# Patient Record
Sex: Female | Born: 1970 | Hispanic: Yes | Marital: Married | Smoking: Never smoker
Health system: Southern US, Community
[De-identification: ages and names within clinical notes are randomized; demographics above are authoritative.]

---

## 2015-12-19 ENCOUNTER — Emergency Department: Payer: Commercial Managed Care - PPO

## 2015-12-19 ENCOUNTER — Emergency Department
Admission: EM | Admit: 2015-12-19 | Discharge: 2015-12-19 | Disposition: A | Discharge status: Home / Self Care | Payer: Commercial Managed Care - PPO | Attending: Emergency Medicine | Admitting: Emergency Medicine

## 2015-12-19 DIAGNOSIS — Z79899 Other long term (current) drug therapy: Secondary | ICD-10-CM | POA: Insufficient documentation

## 2015-12-19 DIAGNOSIS — Z792 Long term (current) use of antibiotics: Secondary | ICD-10-CM | POA: Insufficient documentation

## 2015-12-19 DIAGNOSIS — R079 Chest pain, unspecified: Secondary | ICD-10-CM | POA: Diagnosis not present

## 2015-12-19 DIAGNOSIS — Z3202 Encounter for pregnancy test, result negative: Secondary | ICD-10-CM | POA: Diagnosis not present

## 2015-12-19 LAB — URINALYSIS COMPLETE WITH MICROSCOPIC (ARMC ONLY)
BILIRUBIN URINE: NEGATIVE
GLUCOSE, UA: NEGATIVE mg/dL
Ketones, ur: NEGATIVE mg/dL
Leukocytes, UA: NEGATIVE
NITRITE: NEGATIVE
Protein, ur: 30 mg/dL — AB
Specific Gravity, Urine: 1.008 (ref 1.005–1.030)
pH: 6 (ref 5.0–8.0)

## 2015-12-19 LAB — TROPONIN I
TROPONIN I: 0.03 ng/mL (ref <0.031)
Troponin I: <0.03 ng/mL (ref <0.031)

## 2015-12-19 LAB — CBC
HEMATOCRIT: 38.9 % (ref 35.0–47.0)
Hemoglobin: 13.1 g/dL (ref 12.0–16.0)
MCH: 29.6 pg (ref 26.0–34.0)
MCHC: 33.6 g/dL (ref 32.0–36.0)
MCV: 88.2 fL (ref 80.0–100.0)
PLATELETS: 233 10*3/uL (ref 150–440)
RBC: 4.41 MIL/uL (ref 3.80–5.20)
RDW: 13.1 % (ref 11.5–14.5)
WBC: 9.1 10*3/uL (ref 3.6–11.0)

## 2015-12-19 LAB — BASIC METABOLIC PANEL
Anion gap: 9 (ref 5–15)
BUN: 11 mg/dL (ref 6–20)
CHLORIDE: 107 mmol/L (ref 101–111)
CO2: 24 mmol/L (ref 22–32)
CREATININE: 0.5 mg/dL (ref 0.44–1.00)
Calcium: 9.1 mg/dL (ref 8.9–10.3)
GFR calc Af Amer: >60 mL/min (ref >60)
GFR calc non Af Amer: >60 mL/min (ref >60)
GLUCOSE: 105 mg/dL — AB (ref 65–99)
POTASSIUM: 3.4 mmol/L — AB (ref 3.5–5.1)
Sodium: 140 mmol/L (ref 135–145)

## 2015-12-19 LAB — PREGNANCY, URINE: PREG TEST UR: NEGATIVE

## 2015-12-19 MED ORDER — SODIUM CHLORIDE 0.9 % IV BOLUS (SEPSIS)
1000.0000 mL | Freq: Once | INTRAVENOUS | Status: AC
Start: 2015-12-19 — End: 2015-12-19
  Administered 2015-12-19: 1000 mL via INTRAVENOUS

## 2015-12-19 NOTE — ED Notes (Signed)
Pt from home via EMS, reports she was driving when she began to have chest pain that radiated into her jaw. Pain now subsided into a heaviness

## 2015-12-19 NOTE — ED Notes (Signed)
MD at bedside. 

## 2015-12-19 NOTE — Discharge Instructions (Signed)
You have a follow-up appointment tomorrow at 2 PM with cardiology, Dr. Park Breed. Please make plans to attend this appointment, it is extremely important to have close follow-up.   You have been seen in the Emergency Department (ED) today for chest pain.  As we have discussed todays test results are normal, but you may require further testing.  Please follow up with the recommended doctor as instructed above in these documents regarding todays emergent visit and your recent symptoms to discuss further management.  Continue to take your regular medications. If you are not doing so already, please also take a daily baby aspirin (81 mg), at least until you follow up with your doctor.  Return to the Emergency Department (ED) if you experience any further chest pain/pressure/tightness, difficulty breathing, or sudden sweating, or other symptoms that concern you.   Chest Pain Observation It is often hard to give a specific diagnosis for the cause of chest pain. Among other possibilities your symptoms might be caused by inadequate oxygen delivery to your heart (angina). Angina that is not treated or evaluated can lead to a heart attack (myocardial infarction) or death. Blood tests, electrocardiograms, and X-rays may have been done to help determine a possible cause of your chest pain. After evaluation and observation, your health care provider has determined that it is unlikely your pain was caused by an unstable condition that requires hospitalization. However, a full evaluation of your pain may need to be completed, with additional diagnostic testing as directed. It is very important to keep your follow-up appointments. Not keeping your follow-up appointments could result in permanent heart damage, disability, or death. If there is any problem keeping your follow-up appointments, you must call your health care provider. HOME CARE INSTRUCTIONS  Due to the slight chance that your pain could be angina, it is  important to follow your health care provider's treatment plan and also maintain a healthy lifestyle:  Maintain or work toward achieving a healthy weight.  Stay physically active and exercise regularly.  Decrease your salt intake.  Eat a balanced, healthy diet. Talk to a dietitian to learn about heart-healthy foods.  Increase your fiber intake by including whole grains, vegetables, fruits, and nuts in your diet.  Avoid situations that cause stress, anger, or depression.  Take medicines as advised by your health care provider. Report any side effects to your health care provider. Do not stop medicines or adjust the dosages on your own.  Quit smoking. Do not use nicotine patches or gum until you check with your health care provider.  Keep your blood pressure, blood sugar, and cholesterol levels within normal limits.  Limit alcohol intake to no more than 1 drink per day for women who are not pregnant and 2 drinks per day for men.  Do not abuse drugs. SEEK IMMEDIATE MEDICAL CARE IF: You have severe chest pain or pressure which may include symptoms such as:  You feel pain or pressure in your arms, neck, jaw, or back.  You have severe back or abdominal pain, feel sick to your stomach (nauseous), or throw up (vomit).  You are sweating profusely.  You are having a fast or irregular heartbeat.  You feel short of breath while at rest.  You notice increasing shortness of breath during rest, sleep, or with activity.  You have chest pain that does not get better after rest or after taking your usual medicine.  You wake from sleep with chest pain.  You are unable to sleep because you  cannot breathe.  You develop a frequent cough or you are coughing up blood.  You feel dizzy, faint, or experience extreme fatigue.  You develop severe weakness, dizziness, fainting, or chills. Any of these symptoms may represent a serious problem that is an emergency. Do not wait to see if the symptoms  will go away. Call your local emergency services (911 in the [place] Do not drive yourself to the hospital. MAKE SURE YOU:  Understand these instructions.  Will watch your condition.  Will get help right away if you are not doing well or get worse.   This information is not intended to replace advice given to you by your health care provider. Make sure you discuss any questions you have with your health care provider.   Document Released: 11/25/2010 Document Revised: 10/28/2013 Document Reviewed: 04/24/2013 Elsevier Interactive Patient Education [employer]

## 2015-12-19 NOTE — ED Provider Notes (Signed)
[facility] Emergency Department Provider Note  ____________________________________________  Time seen: Approximately 8:30 PM  I have reviewed the triage vital signs and the nursing notes.   HISTORY  Chief Complaint Chest Pain    HPI Teana Lindahl is a 45 y.o. female reports no previous medical history other than a recent sore throat which improved after antibiotics a week ago. Also some acid reflux at times.  Patient reports that throughout the entire day today she's felt somewhat fatigued, today while driving her car she began to experience a tight feeling in her chest that radiated up into her jaw. She pulled the car over and called 911. Sure reports that she was given aspirin by the paramedics and her pain has now improved. She currently has no pain or discomfort.  Denies fevers chills cough trouble breathing. She does not take any birth control. She does not smoke. No early-onset heart disease in her family.  Takes no estrogen. No cough. No long trips or travel. No leg swelling. History of any blood clots. No recent surgery or hospitalization. Nonsmoker.  History reviewed. No pertinent past medical history.  There are no active problems to display for this patient.   History reviewed. No pertinent past surgical history.  Current Outpatient Rx  Name  Route  Sig  Dispense  Refill  . amoxicillin (AMOXIL) 500 MG capsule   Oral   Take 500 mg by mouth 3 (three) times daily.         . lansoprazole (PREVACID) 30 MG capsule   Oral   Take 30 mg by mouth daily at 12 noon.           Allergies Review of patient's allergies indicates no known allergies.  No family history on file.  Social History Social History  Substance Use Topics  . Smoking status: Never Smoker   . Smokeless tobacco: None  . Alcohol Use: No    Review of Systems Constitutional: No fever/chills Eyes: No visual changes. ENT: No sore throat. Cardiovascular: See history of  present illness Respiratory: Denies shortness of breath. Gastrointestinal: No abdominal pain.  No nausea, no vomiting.  No diarrhea.  No constipation. Genitourinary: Negative for dysuria. Musculoskeletal: Negative for back pain. Skin: Negative for rash. Neurological: Negative for headaches, focal weakness or numbness.  10-point ROS otherwise negative.  ____________________________________________   PHYSICAL EXAM:  VITAL SIGNS: ED Triage Vitals  Enc Vitals Group     BP --      Pulse --      Resp --      Temp --      Temp src --      SpO2 --      Weight --      Height --      Head Cir --      Peak Flow --      Pain Score 12/19/15 1935 7     Pain Loc --      Pain Edu? --      Excl. in GC? --    Constitutional: Alert and oriented. Well appearing and in no acute distress. Sitting up in the stretcher conversant. Currently denying any pain or symptoms. Eyes: Conjunctivae are normal. PERRL. EOMI. Head: Atraumatic. Nose: No congestion/rhinnorhea. Mouth/Throat: Mucous membranes are moist.  Oropharynx non-erythematous. Neck: No stridor.   Cardiovascular: Normal rate, regular rhythm. Grossly normal heart sounds.  Good peripheral circulation. Respiratory: Normal respiratory effort.  No retractions. Lungs CTAB. Gastrointestinal: Soft and nontender. No distention. Musculoskeletal: No  lower extremity tenderness nor edema.   Neurologic:  Normal speech and language. No gross focal neurologic deficits are appreciated.  Detailed neuro exam performed by me at bedside. The patient has no pronator drift. The patient has normal cranial nerve exam. Extraocular movements are normal. Visual fields are normal. Patient has 5 out of 5 strength in all extremities. There is no numbness or gross, acute sensory abnormality in the extremities bilaterally. No speech disturbance. No dysarthria. No aphasia. No ataxia. Patient speaking in full and clear sentences. No headache. Patient denies any  neurologic symptoms.   Skin:  Skin is warm, dry and intact. No rash noted. Psychiatric: Mood and affect are normal. Speech and behavior are normal.  ____________________________________________   LABS (all labs ordered are listed, but only abnormal results are displayed)  Labs Reviewed  BASIC METABOLIC PANEL - Abnormal; Notable for the following:    Potassium 3.4 (*)    Glucose, Bld 105 (*)    All other components within normal limits  URINALYSIS COMPLETEWITH MICROSCOPIC ([facility] ONLY) - Abnormal; Notable for the following:    Color, Urine YELLOW (*)    APPearance CLOUDY (*)    Hgb urine dipstick 3+ (*)    Protein, ur 30 (*)    Bacteria, UA RARE (*)    Squamous Epithelial / LPF 6-30 (*)    All other components within normal limits  CBC  TROPONIN I  PREGNANCY, URINE  TROPONIN I   ____________________________________________  EKG  Reviewed and interpreted me at 1930 PR 190 QRS 95 QTc 440 T waves normal except for a minimal T-wave inversion noted in V3 alone, which may be normal variant. ____________________________________________  RADIOLOGY  DG Chest 2 View (Final result) Result time: 12/19/15 21:22:31   Final result by Rad Results In Interface (12/19/15 21:22:31)   Narrative:   CLINICAL DATA: Acute onset of chest pain which began about 5:30 p.m. this evening.  EXAM: CHEST 2 VIEW  COMPARISON: None.  FINDINGS: Cardiac silhouette upper normal in size. Hilar and mediastinal contours unremarkable. Lungs clear. Bronchovascular markings normal. Pulmonary vascularity normal. No visible pleural effusions. No pneumothorax. 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  IMPRESSION: Borderline heart size. No acute cardiopulmonary disease.   Electronically Signed By: Hulan Saas M.D. On: 12/19/2015 21:22    ____________________________________________   PROCEDURES  Procedure(s) performed: None  Critical  Care performed: No  ____________________________________________   INITIAL IMPRESSION / ASSESSMENT AND PLAN / ED COURSE  Pertinent labs & imaging results that were available during my care of the patient were reviewed by me and considered in my medical decision making (see chart for details).  Patient transfer evaluation of chest pain. She has no significant risk factors for coronary disease. Patient's EKG reassuring, with a minimal T-wave abnormality in V3 which may be normal variant. No evidence of acute or significant ST elevation or severe depressions. Her pain and symptoms are currently gone and she is asymptomatic. Patient did have some generalized fatigue today, and does not complain of any dyspnea ripping tearing or moving pain.      Pulmonary Embolism Rule-out Criteria (PERC rule)                        If YES to ANY of the following, the PERC rule is not satisfied and cannot be used to rule out PE in this patient (consider d-dimer or imaging depending on pre-test probability).  If NO to ALL of the following, AND the clinician's pre-test probability is <15%, the [facility] rule is satisfied and there is no need for further workup (including no need to obtain a d-dimer) as the post-test probability of pulmonary embolism is <2%.                      Mnemonic is HAD CLOTS   H - hormone use (exogenous estrogen)      No. A - age > 50                                                 No. D - DVT/PE history                                      No.   C - coughing blood (hemoptysis)                 No. L - leg swelling, unilateral                             No. O - O2 Sat on Room Air < 95%                  No. T - tachycardia (HR ? 100)                         No. S - surgery or trauma, recent                      No.   Based on my evaluation of the patient, including application of this decision instrument, further testing to evaluate for pulmonary embolism is not  indicated at this time. I have discussed this recommendation with the patient who states understanding and agreement with this plan.   Tingling no significant risk factors for pulmonary embolism or dissection. Based on the patient's history, now asymptomatic status plan to observe her in the emergency room check second troponin 3 hours. Discuss case and care with Dr. Park Breed of cardiology who advises discharge and close follow-up if second troponin is normal. Think this is agreeable plan, patient's heart score is low risk and she is currently asymptomatic capable of close follow-up following good return precautions.  ----------------------------------------- 11:20 PM on 12/19/2015 -----------------------------------------  Second troponin within normal limits. Patient remained stable, alert and asymptomatic. She is agreeable and an appointment was made for follow-up at 2 PM tomorrow with cardiology whom she advises she will build up follow-up with.  Discharge to home. Careful chest pain return precautions discussed the patient and her husband are both agreeable. ____________________________________________   FINAL CLINICAL IMPRESSION(S) / ED DIAGNOSES  Final diagnoses:  Chest pain with low risk for cardiac etiology      Sharyn Creamer, MD 12/19/15 2321

## 2017-01-05 IMAGING — CR DG CHEST 2V
2 series · 2 of 2 positions shown · non-contrast
Comparison: None.

CLINICAL DATA: Acute onset of chest pain which began about [DATE]
p.m. this evening.

EXAM:
CHEST  2 VIEW

[chest pa]
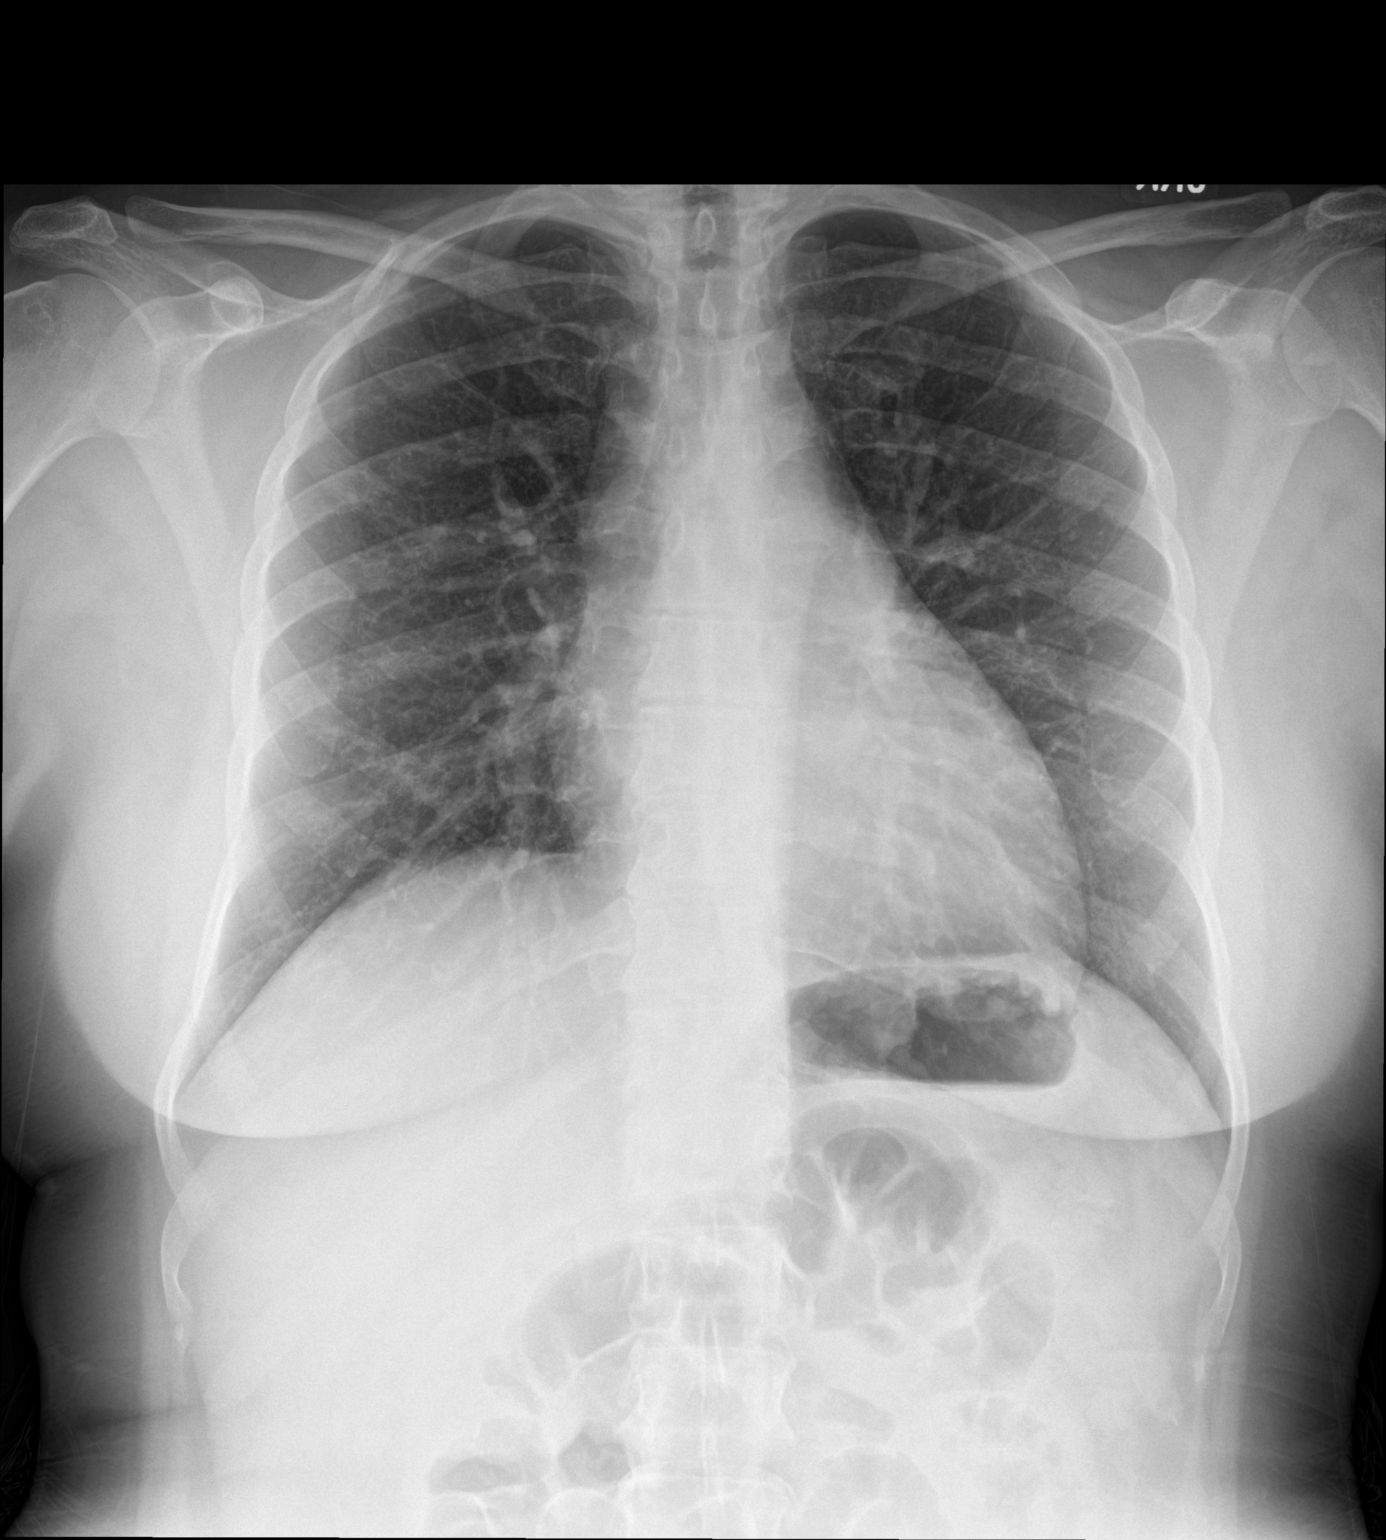

[chest lat]
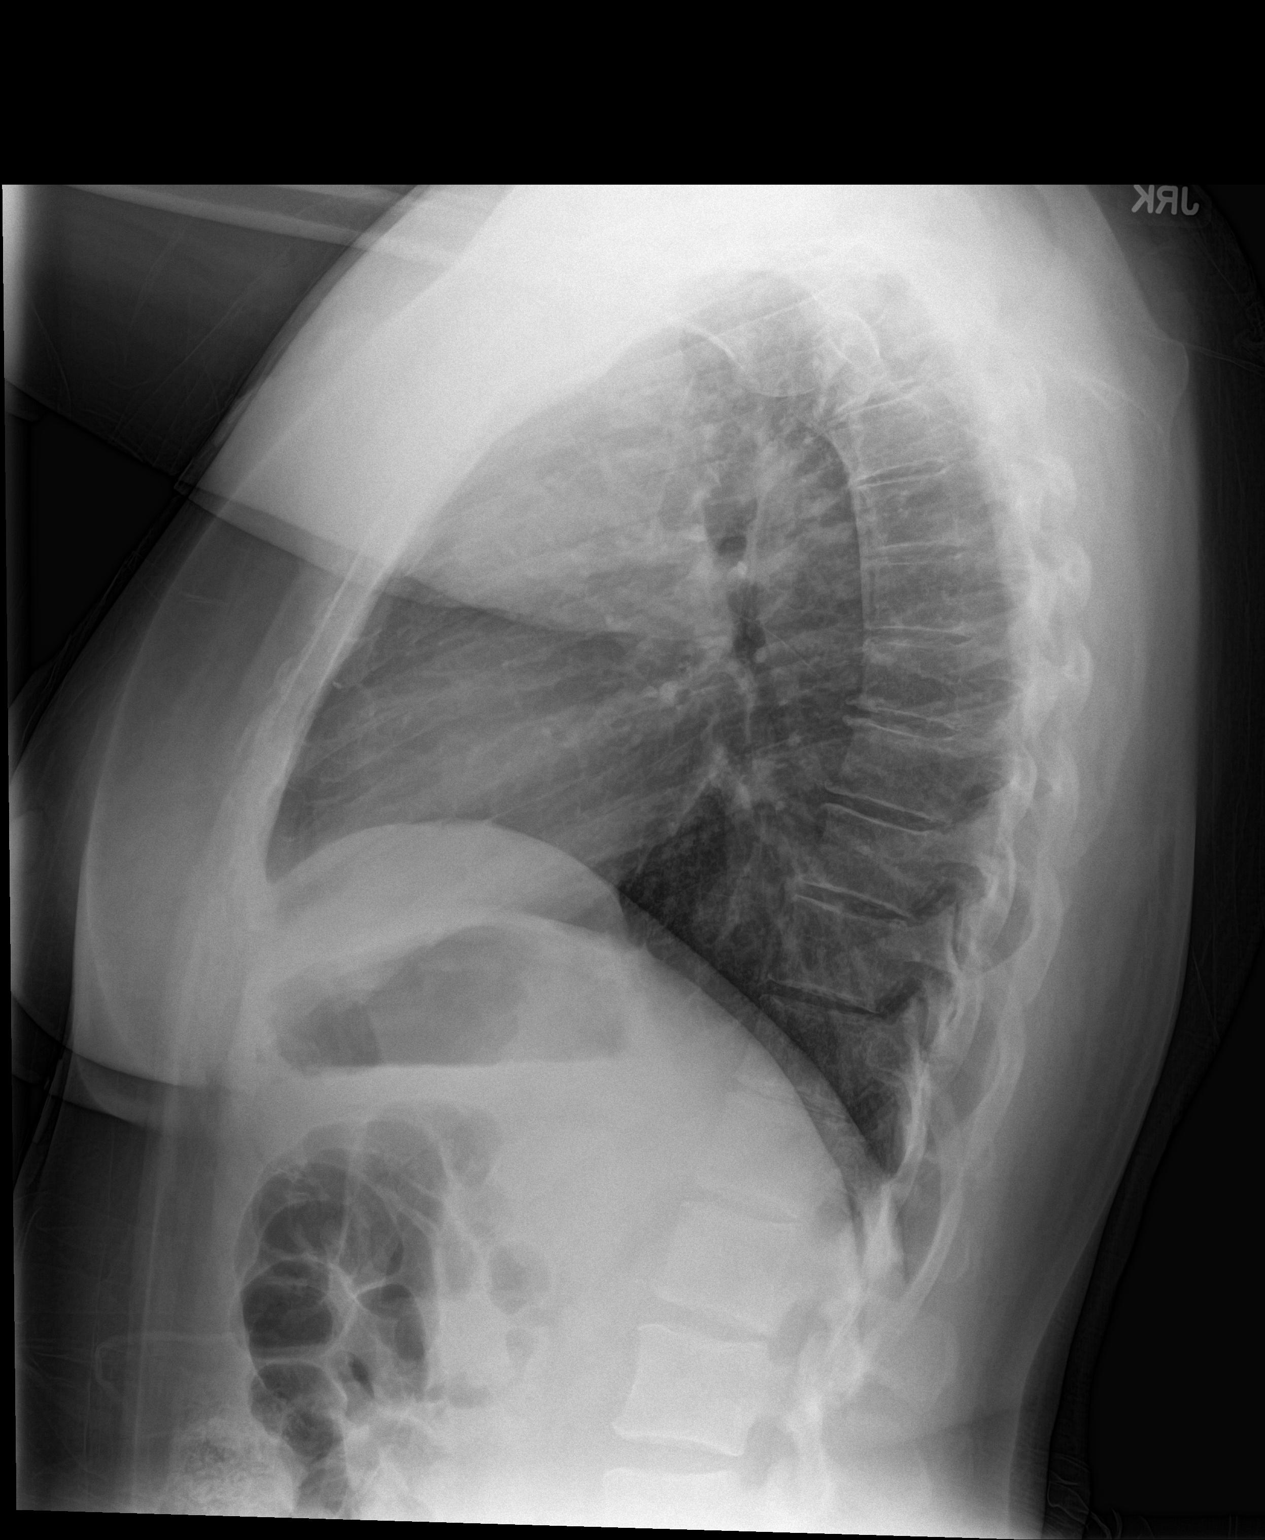

[2 of 2 positions shown; findings below may reference images not displayed]

FINDINGS: Cardiac silhouette upper normal in size. Hilar and mediastinal
contours unremarkable. Lungs clear. Bronchovascular markings normal.
Pulmonary vascularity normal. No visible pleural effusions. No
pneumothorax. Mild degenerative disc disease and spondylosis
involving the mid thoracic spine.
IMPRESSION: Borderline heart size.  No acute cardiopulmonary disease.

## 2017-12-02 DIAGNOSIS — Z79899 Other long term (current) drug therapy: Secondary | ICD-10-CM

## 2017-12-02 DIAGNOSIS — K219 Gastro-esophageal reflux disease without esophagitis: Secondary | ICD-10-CM

## 2017-12-02 DIAGNOSIS — R079 Chest pain, unspecified: Secondary | ICD-10-CM

## 2017-12-02 DIAGNOSIS — R0602 Shortness of breath: Secondary | ICD-10-CM

## 2017-12-02 DIAGNOSIS — R110 Nausea: Secondary | ICD-10-CM

## 2021-05-26 ENCOUNTER — Ambulatory Visit: Payer: Self-pay | Admitting: Gastroenterology

## 2021-07-05 ENCOUNTER — Telehealth: Payer: Self-pay | Admitting: Family Medicine

## 2021-07-19 ENCOUNTER — Encounter: Payer: Self-pay | Admitting: Family Medicine

## 2021-08-29 ENCOUNTER — Encounter: Payer: Self-pay | Admitting: Family Medicine

## 2021-12-30 ENCOUNTER — Other Ambulatory Visit: Payer: Self-pay | Admitting: Family Medicine

## 2021-12-30 DIAGNOSIS — N920 Excessive and frequent menstruation with regular cycle: Secondary | ICD-10-CM

## 2022-01-09 ENCOUNTER — Other Ambulatory Visit: Payer: Self-pay

## 2022-01-09 ENCOUNTER — Ambulatory Visit
Admission: RE | Admit: 2022-01-09 | Discharge: 2022-01-09 | Disposition: A | Discharge status: Home / Self Care | Payer: 59 | Source: Ambulatory Visit | Attending: Family Medicine | Admitting: Family Medicine

## 2022-01-09 DIAGNOSIS — N920 Excessive and frequent menstruation with regular cycle: Secondary | ICD-10-CM | POA: Insufficient documentation

## 2023-01-27 IMAGING — US US PELVIS COMPLETE WITH TRANSVAGINAL
1 series · 13 of 25 positions shown · non-contrast
Comparison: None

CLINICAL DATA: Excessive or frequent menstruation

EXAM:
TRANSABDOMINAL AND TRANSVAGINAL ULTRASOUND OF PELVIS
TECHNIQUE: Both transabdominal and transvaginal ultrasound examinations of the
pelvis were performed. Transabdominal technique was performed for
global imaging of the pelvis including uterus, ovaries, adnexal
regions, and pelvic cul-de-sac. It was necessary to proceed with
endovaginal exam following the transabdominal exam to visualize the
uterus endometrium ovaries.

[Series 1: us pelvis complete with transvaginal · 0.25mm/px · 13 of 124 slices shown]
[im 1/124]
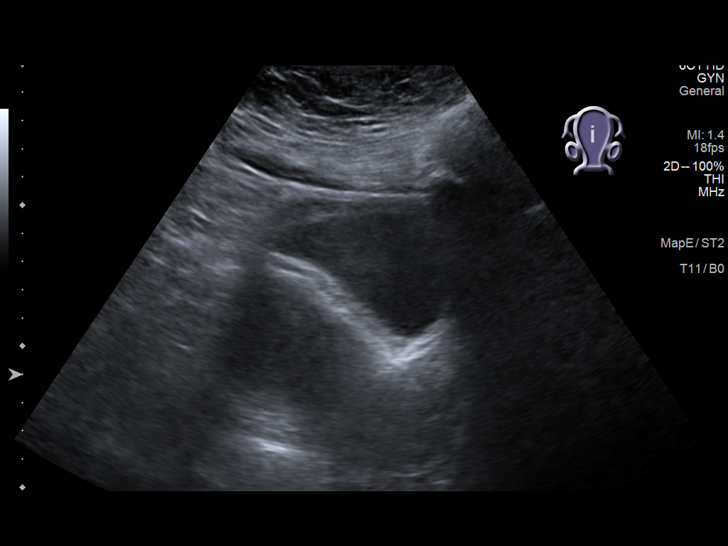
[im 11/124]
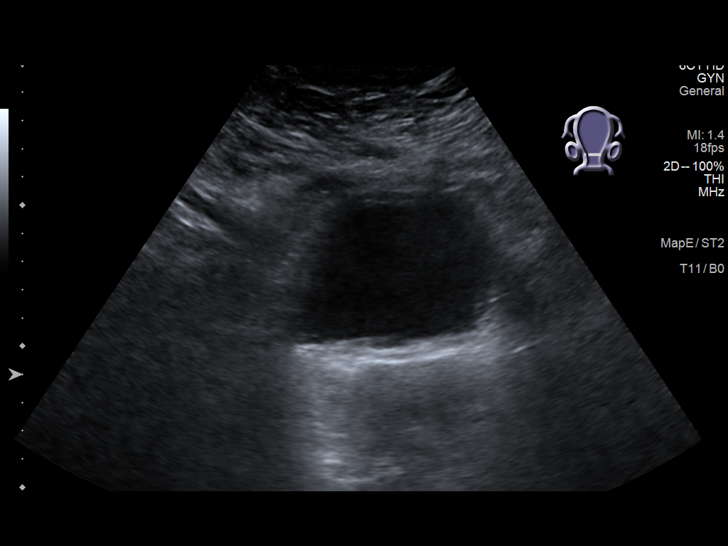
[im 21/124]
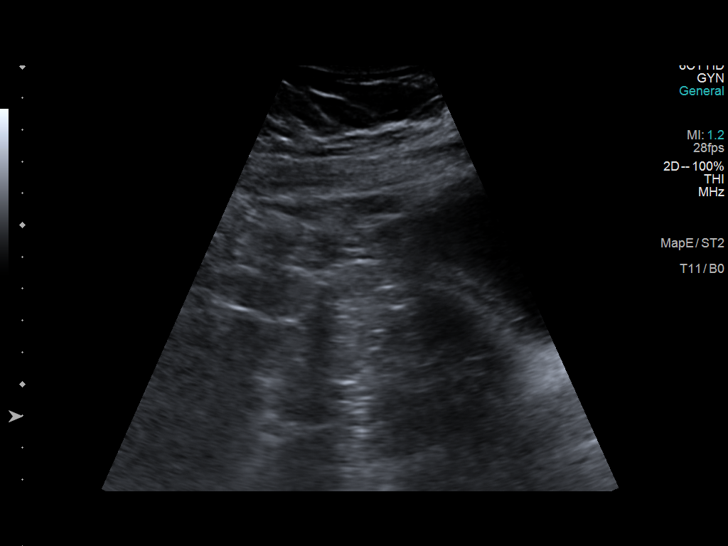
[im 31/124]
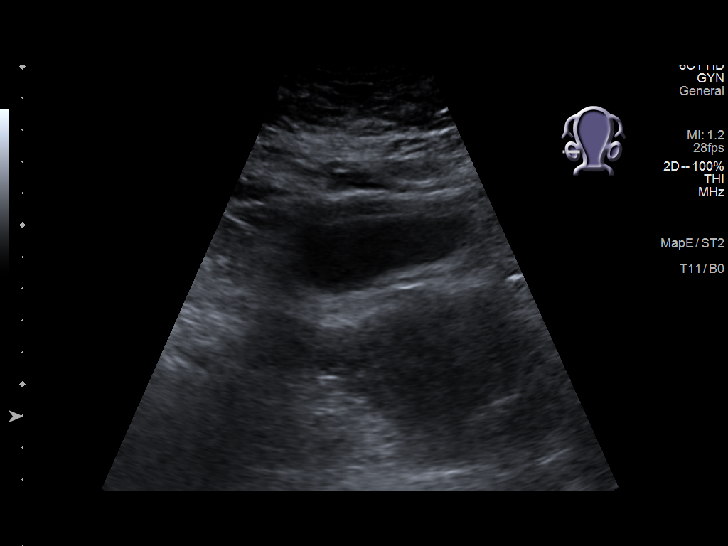
[im 42/124]
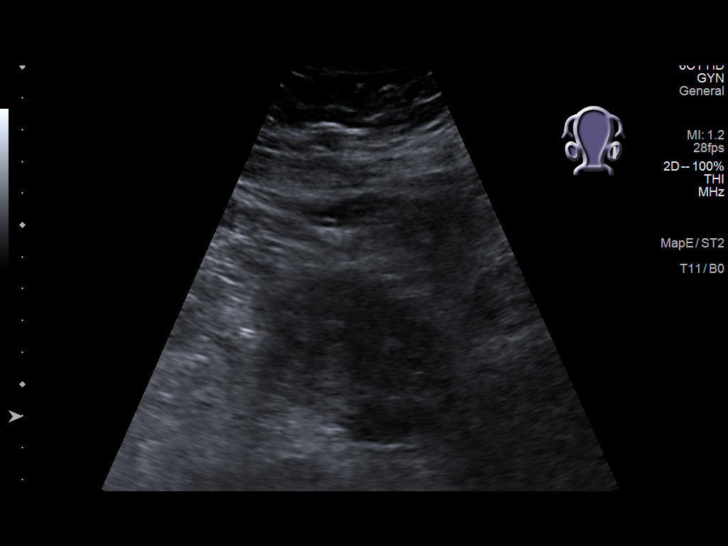
[im 52/124]
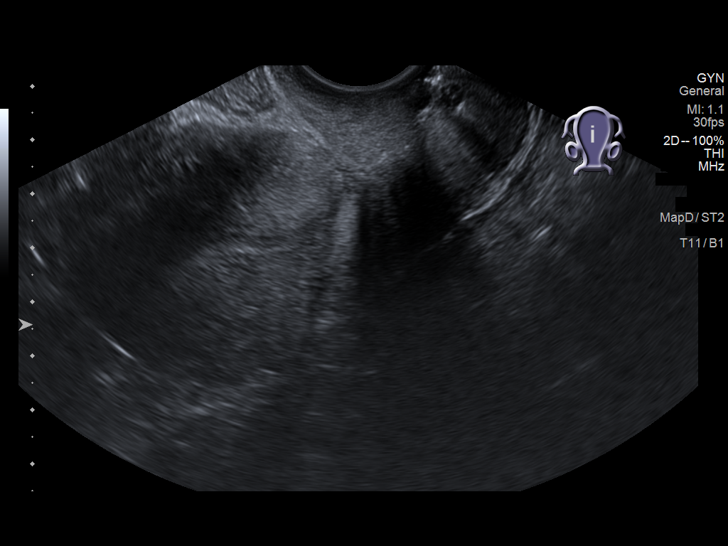
[im 62/124]
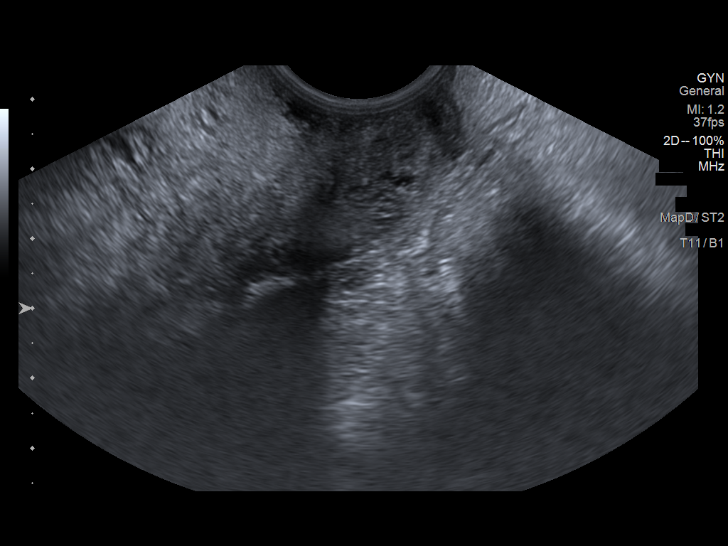
[im 72/124]
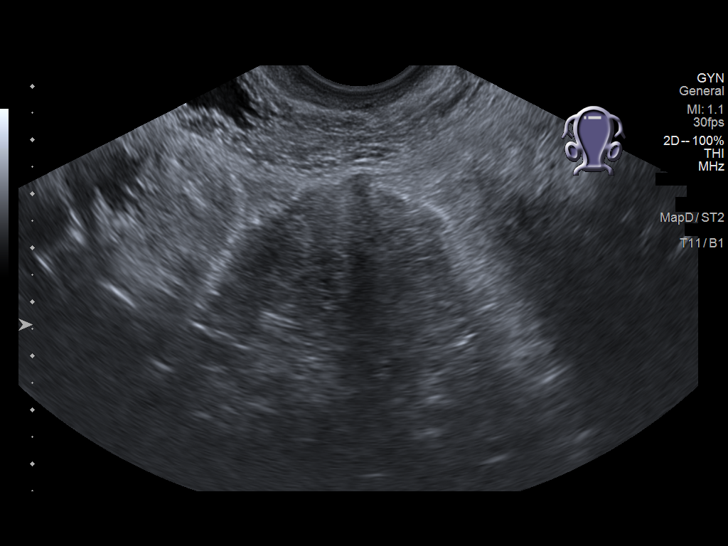
[im 83/124]
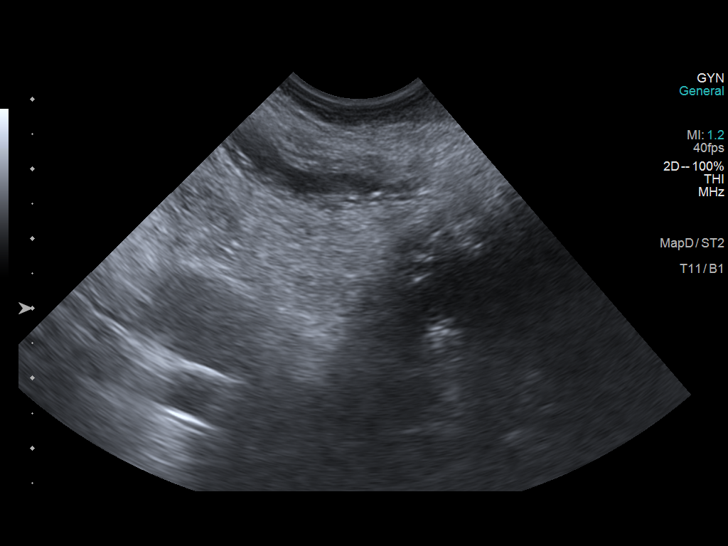
[im 93/124]
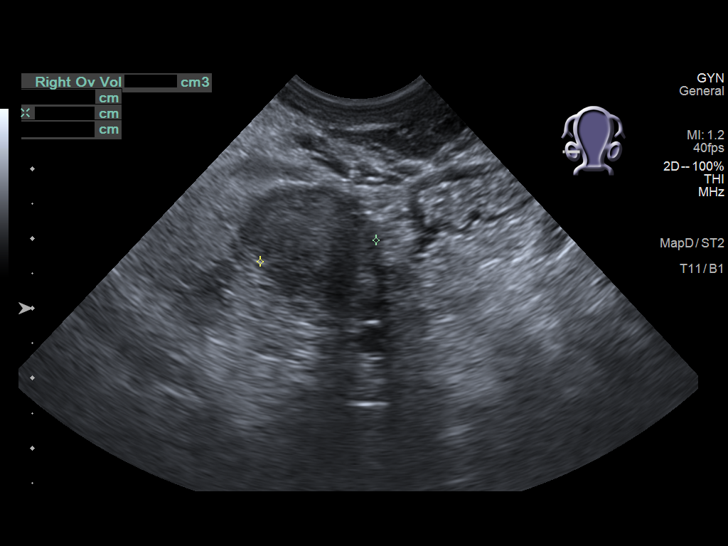
[im 103/124]
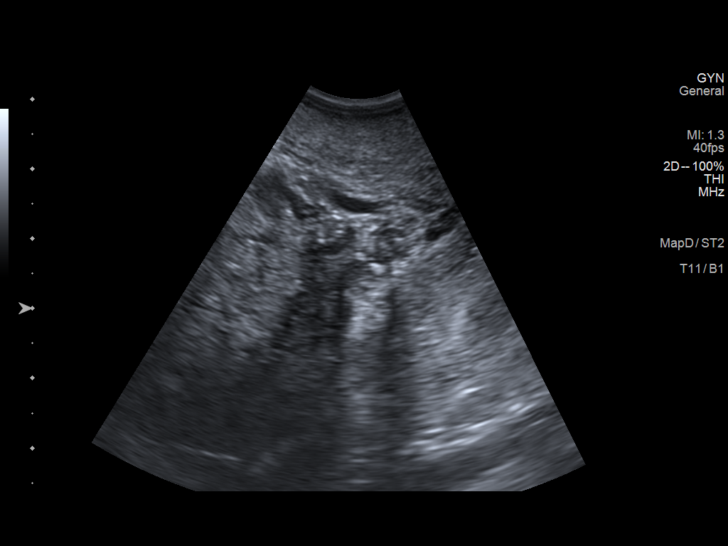
[im 113/124]
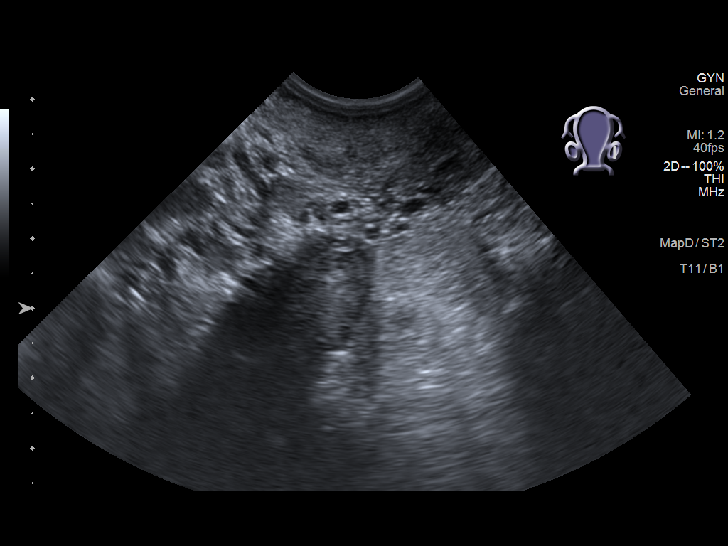
[im 124/124]
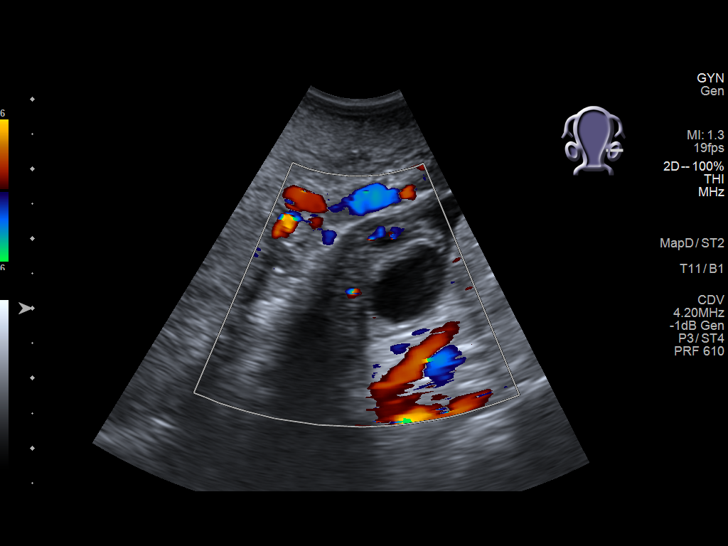

[13 of 25 positions shown; findings below may reference images not displayed]

FINDINGS: Uterus

Measurements: 8.8 x 4.7 x 5.7 cm = volume: 125 mL. No fibroids or
other mass visualized.

Endometrium

Thickness: 6 mm.  No focal abnormality visualized.

Right ovary

Measurements: 2.6 x 1.9 x 1.7 cm = volume: 4.2 mL. Normal
appearance/no adnexal mass.

Left ovary

Measurements: 2.2 x 1.8 x 2 cm = volume: 4.3 mL. Normal
appearance/no adnexal mass.

Other findings

No abnormal free fluid.
IMPRESSION: Negative examination. Endometrial thickness of 6 mm. If bleeding
remains unresponsive to hormonal or medical therapy, sonohysterogram
should be considered for focal lesion work-up. (Ref: Radiological
Reasoning: Algorithmic Workup of Abnormal Vaginal Bleeding with
Endovaginal Sonography and Sonohysterography. AJR [id number]; [id number])
